# Patient Record
Sex: Female | Born: 1985 | Race: Black or African American | Hispanic: No | Marital: Single | Smoking: Never smoker
Health system: Southern US, Community
[De-identification: ages and names within clinical notes are randomized; demographics above are authoritative.]

---

## 2007-02-16 ENCOUNTER — Emergency Department (HOSPITAL_COMMUNITY): Admission: EM | Admit: 2007-02-16 | Discharge: 2007-02-16 | Payer: Self-pay | Admitting: Emergency Medicine

## 2007-06-07 ENCOUNTER — Inpatient Hospital Stay (HOSPITAL_COMMUNITY): Admission: AC | Admit: 2007-06-07 | Discharge: 2007-06-11 | Payer: Self-pay
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
## 2009-01-21 ENCOUNTER — Inpatient Hospital Stay (HOSPITAL_COMMUNITY): Admission: AD | Admit: 2009-01-21 | Discharge: 2009-01-21 | Payer: Self-pay | Admitting: Obstetrics and Gynecology

## 2009-03-16 ENCOUNTER — Inpatient Hospital Stay (HOSPITAL_COMMUNITY): Admission: AD | Admit: 2009-03-16 | Discharge: 2009-03-19 | Payer: Self-pay | Admitting: Obstetrics and Gynecology

## 2010-03-24 ENCOUNTER — Emergency Department (HOSPITAL_COMMUNITY): Admission: EM | Admit: 2010-03-24 | Discharge: 2010-03-24 | Payer: Self-pay | Admitting: Family Medicine

## 2010-08-16 LAB — POCT RAPID STREP A (OFFICE): Streptococcus, Group A Screen (Direct): NEGATIVE

## 2010-09-07 LAB — CBC
Hemoglobin: 11.3 g/dL — ABNORMAL LOW (ref 12.0–15.0)
MCV: 79.1 fL (ref 78.0–100.0)
Platelets: 214 10*3/uL (ref 150–400)
RBC: 3.95 MIL/uL (ref 3.87–5.11)
RDW: 15.3 % (ref 11.5–15.5)
WBC: 10.3 10*3/uL (ref 4.0–10.5)

## 2010-09-09 LAB — WET PREP, GENITAL: Trich, Wet Prep: NONE SEEN
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
## 2011-02-22 LAB — CBC
HCT: 33.3 — ABNORMAL LOW
HCT: 33.8 — ABNORMAL LOW
HCT: 34.7 — ABNORMAL LOW
HCT: 36.1
HCT: 37
HCT: 37.3
HCT: 38.8
Hemoglobin: 10.8 — ABNORMAL LOW
Hemoglobin: 11.2 — ABNORMAL LOW
Hemoglobin: 11.5 — ABNORMAL LOW
Hemoglobin: 11.6 — ABNORMAL LOW
Hemoglobin: 12.4
MCHC: 32
MCHC: 32.2
MCV: 72.7 — ABNORMAL LOW
MCV: 74 — ABNORMAL LOW
MCV: 74.4 — ABNORMAL LOW
Platelets: 176
Platelets: 188
Platelets: 193
Platelets: 207
Platelets: 222
Platelets: 243
RBC: 4.51
RBC: 4.86
RBC: 5.23 — ABNORMAL HIGH
RDW: 13.5
RDW: 13.7
RDW: 13.8
RDW: 13.8
WBC: 5.9
WBC: 6.7
WBC: 6.7
WBC: 7.7
WBC: 8.2
WBC: 8.4

## 2011-02-22 LAB — BASIC METABOLIC PANEL
BUN: 3 — ABNORMAL LOW
Calcium: 8.3 — ABNORMAL LOW
Calcium: 8.5
Creatinine, Ser: 0.87
Creatinine, Ser: 0.87
GFR calc non Af Amer: >60
Glucose, Bld: 100 — ABNORMAL HIGH
Potassium: 4.2

## 2011-02-22 LAB — SAMPLE TO BLOOD BANK

## 2011-02-22 LAB — PROTIME-INR: Prothrombin Time: 13.8

## 2011-02-22 LAB — DIFFERENTIAL
Basophils Absolute: 0
Basophils Relative: 0
Eosinophils Relative: 2
Monocytes Absolute: 0.4
Monocytes Relative: 7
Neutro Abs: 4.4

## 2018-12-22 ENCOUNTER — Other Ambulatory Visit: Payer: Self-pay

## 2018-12-22 ENCOUNTER — Emergency Department
Admission: EM | Admit: 2018-12-22 | Discharge: 2018-12-22 | Disposition: A | Discharge status: Home / Self Care | Payer: BC Managed Care – PPO | Source: Home / Self Care | Attending: Family Medicine | Admitting: Family Medicine

## 2018-12-22 ENCOUNTER — Encounter: Payer: Self-pay | Admitting: Emergency Medicine

## 2018-12-22 DIAGNOSIS — J020 Streptococcal pharyngitis: Secondary | ICD-10-CM | POA: Diagnosis not present

## 2018-12-22 DIAGNOSIS — J039 Acute tonsillitis, unspecified: Secondary | ICD-10-CM | POA: Diagnosis not present

## 2018-12-22 LAB — POCT RAPID STREP A (OFFICE): Rapid Strep A Screen: POSITIVE — AB

## 2018-12-22 MED ORDER — IBUPROFEN 100 MG/5ML PO SUSP
600.0000 mg | Freq: Once | ORAL | Status: AC
  Administered 2018-12-22: 600 mg via ORAL

## 2018-12-22 MED ORDER — IBUPROFEN 100 MG/5ML PO SUSP: 400.0000 mg | Freq: Four times a day (QID) | ORAL | 0 refills | Status: DC

## 2018-12-22 MED ORDER — PENICILLIN G BENZATHINE & PROC 1200000 UNIT/2ML IM SUSP
1.2000 10*6.[IU] | Freq: Once | INTRAMUSCULAR | Status: AC
  Administered 2018-12-22: 1.2 10*6.[IU] via INTRAMUSCULAR

## 2018-12-22 MED ORDER — ACETAMINOPHEN 160 MG/5ML PO ELIX: 500.0000 mg | ORAL_SOLUTION | ORAL | 0 refills | Status: DC | PRN

## 2018-12-22 MED ORDER — DEXAMETHASONE SODIUM PHOSPHATE 10 MG/ML IJ SOLN
10.0000 mg | Freq: Once | INTRAMUSCULAR | Status: AC
  Administered 2018-12-22: 10 mg via INTRAMUSCULAR

## 2018-12-22 NOTE — ED Provider Notes (Signed)
[facility]    CSN: [id number] Arrival date & time: 12/22/18  [id number]     History   Chief Complaint Chief Complaint  Patient presents with  . Sore Throat  . Chills    HPI Megan Campos is a 33 y.o. female.   HPI Megan Campos is a 33 y.o. female presenting to UC with c/o sore throat, subjective fever and chills that started 2 days ago. Mild headache and nausea w/o vomiting. She has taken OTC Tylenol with mild relief. Last dose of tylenol earlier this morning. She is able to keep down fluids but loss of appetite due to severe sore throat. Denies known sick contacts. No recent travel. Denies cough, congestion, chest tightness or SOB.   History reviewed. No pertinent past medical history.  There are no active problems to display for this patient.   History reviewed. No pertinent surgical history.  OB History   No obstetric history on file.      Home Medications    Prior to Admission medications   Medication Sig Start Date End Date Taking? Authorizing Provider  acetaminophen (TYLENOL) 325 MG tablet Take 650 mg by mouth every 6 (six) hours as needed.   Yes [provider]  acetaminophen (TYLENOL) 160 MG/5ML elixir Take 15.6 mLs (500 mg total) by mouth every 4 (four) hours as needed for fever or pain. 12/22/18   Lurene ShadowPhelps, Cathi Hazan O, PA-C  ibuprofen (ADVIL) 100 MG/5ML suspension Take 20-30 mLs (400-600 mg total) by mouth every 6 (six) hours. As need for pain 12/22/18   Rolla PlatePhelps, Ghali Morissette O, PA-C    Family History History reviewed. No pertinent family history.  Social History Social History   Tobacco Use  . Smoking status: Never Smoker  . Smokeless tobacco: Never Used  Substance Use Topics  . Alcohol use: Not Currently  . Drug use: Not Currently     Allergies   Patient has no known allergies.   Review of Systems Review of Systems  Constitutional: Positive for chills, fatigue and fever.  HENT: Positive for sore throat. Negative for congestion, ear pain,  trouble swallowing and voice change.   Respiratory: Negative for cough and shortness of breath.   Cardiovascular: Negative for chest pain and palpitations.  Gastrointestinal: Positive for nausea. Negative for abdominal pain, diarrhea and vomiting.  Musculoskeletal: Negative for arthralgias, back pain and myalgias.  Skin: Negative for rash.  Neurological: Positive for headaches. Negative for dizziness and light-headedness.     Physical Exam Triage Vital Signs ED Triage Vitals  Enc Vitals Group     BP      Pulse      Resp      Temp      Temp src      SpO2      Weight      Height      Head Circumference      Peak Flow      Pain Score      Pain Loc      Pain Edu?      Excl. in GC?    No data found.  Updated Vital Signs BP 99/64 (BP Location: Right Arm)   Pulse (!) 104   Temp 99 F (37.2 C) (Oral)   LMP 12/10/2018 (Exact Date)   SpO2 100%   Visual Acuity Right Eye Distance:   Left Eye Distance:   Bilateral Distance:    Right Eye Near:   Left Eye Near:    Bilateral Near:  Physical Exam Vitals signs and nursing note reviewed.  Constitutional:      Appearance: She is well-developed.  HENT:     Head: Normocephalic and atraumatic.     Right Ear: Tympanic membrane normal.     Left Ear: Tympanic membrane normal.     Nose: Nose normal.     Right Sinus: No maxillary sinus tenderness or frontal sinus tenderness.     Left Sinus: No maxillary sinus tenderness or frontal sinus tenderness.     Mouth/Throat:     Lips: Pink.     Mouth: Mucous membranes are moist.     Pharynx: Uvula midline. Pharyngeal swelling and posterior oropharyngeal erythema present. No oropharyngeal exudate.     Tonsils: Tonsillar exudate present. No tonsillar abscesses. 3+ on the right. 3+ on the left.  Neck:     Musculoskeletal: Normal range of motion and neck supple.  Cardiovascular:     Rate and Rhythm: Normal rate and regular rhythm.  Pulmonary:     Effort: Pulmonary effort is normal. No  respiratory distress.     Breath sounds: Normal breath sounds. No stridor. No wheezing, rhonchi or rales.  Musculoskeletal: Normal range of motion.  Lymphadenopathy:     Cervical: Cervical adenopathy present.  Skin:    General: Skin is warm and dry.  Neurological:     Mental Status: She is alert and oriented to person, place, and time.  Psychiatric:        Behavior: Behavior normal.      UC Treatments / Results  Labs (all labs ordered are listed, but only abnormal results are displayed) Labs Reviewed  POCT RAPID STREP A (OFFICE) - Abnormal; Notable for the following components:      Result Value   Rapid Strep A Screen Positive (*)    All other components within normal limits    EKG   Radiology No results found.  Procedures Procedures (including critical care time)  Medications Ordered in UC Medications  penicillin g procaine-penicillin g benzathine (BICILLIN-CR) injection 600000-600000 units (1.2 Million Units Intramuscular Given 12/22/18 1105)  dexamethasone (DECADRON) injection 10 mg (10 mg Intramuscular Given 12/22/18 1106)  ibuprofen (ADVIL) 100 MG/5ML suspension 600 mg (600 mg Oral Given 12/22/18 1106)    Initial Impression / Assessment and Plan / UC Course  I have reviewed the triage vital signs and the nursing notes.  Pertinent labs & imaging results that were available during my care of the patient were reviewed by me and considered in my medical decision making (see chart for details).     Rapid strep: POSITIVE Discussed treatment options. Pt agreeable to have PCN G given today.   Home care info provided.  Final Clinical Impressions(s) / UC Diagnoses   Final diagnoses:  Exudative tonsillitis  Strep pharyngitis     Discharge Instructions      You have been given a shot of a strong antibiotic, which should stay in your system treating your symptoms.  You should feel better over the next few days and definitely by the end of the week. If you are not  feeling improvement, please be reevaluated by a medical provider.   You may take 500mg  acetaminophen every 4-6 hours or in combination with ibuprofen 400-600mg  every 6-8 hours as needed for pain, inflammation, and fever. (a liquid version has been prescribed and sent to your pharmacy to make it easier to swallow these medications)  Be sure to drink at least eight 8oz glasses of water to stay well hydrated (you  may also suck on ice chips and popsicles, sip on warm broth, and eat soft foods such as Jello and apple sauce to help you stay hydrated) and get at least 8 hours of sleep at night, preferably more while sick.   Call 911 or have someone drive you to the hospital if you develop difficulty breathing, unable to swallow liquids, or other new concerning symptoms develop.    ED Prescriptions    Medication Sig Dispense Auth. Provider   ibuprofen (ADVIL) 100 MG/5ML suspension Take 20-30 mLs (400-600 mg total) by mouth every 6 (six) hours. As need for pain 272 mL Gerarda Fraction, Breyden Jeudy O, PA-C   acetaminophen (TYLENOL) 160 MG/5ML elixir Take 15.6 mLs (500 mg total) by mouth every 4 (four) hours as needed for fever or pain. 237 mL Noe Gens, PA-C     Controlled Substance Prescriptions [place] Controlled Substance Registry consulted? Not Applicable   Tyrell Antonio 12/23/18 1583

## 2018-12-22 NOTE — ED Notes (Signed)
Sore throat, swollen tonsils since yesterday

## 2018-12-22 NOTE — Discharge Instructions (Signed)
°  You have been given a shot of a strong antibiotic, which should stay in your system treating your symptoms.  You should feel better over the next few days and definitely by the end of the week. If you are not feeling improvement, please be reevaluated by a medical provider.   You may take 500mg  acetaminophen every 4-6 hours or in combination with ibuprofen 400-600mg  every 6-8 hours as needed for pain, inflammation, and fever. (a liquid version has been prescribed and sent to your pharmacy to make it easier to swallow these medications)  Be sure to drink at least eight 8oz glasses of water to stay well hydrated (you may also suck on ice chips and popsicles, sip on warm broth, and eat soft foods such as Jello and apple sauce to help you stay hydrated) and get at least 8 hours of sleep at night, preferably more while sick.   Call 911 or have someone drive you to the hospital if you develop difficulty breathing, unable to swallow liquids, or other new concerning symptoms develop.

## 2018-12-24 ENCOUNTER — Telehealth: Payer: Self-pay

## 2018-12-24 ENCOUNTER — Other Ambulatory Visit: Payer: Self-pay

## 2018-12-24 ENCOUNTER — Emergency Department (INDEPENDENT_AMBULATORY_CARE_PROVIDER_SITE_OTHER)
Admission: EM | Admit: 2018-12-24 | Discharge: 2018-12-24 | Disposition: A | Discharge status: Home / Self Care | Payer: BC Managed Care – PPO | Source: Home / Self Care | Attending: Family Medicine | Admitting: Family Medicine

## 2018-12-24 DIAGNOSIS — J0300 Acute streptococcal tonsillitis, unspecified: Secondary | ICD-10-CM | POA: Diagnosis not present

## 2018-12-24 LAB — POCT MONO SCREEN (KUC): Mono, POC: NEGATIVE

## 2018-12-24 MED ORDER — LIDOCAINE VISCOUS HCL 2 % MT SOLN: 15.0000 mL | Freq: Three times a day (TID) | OROMUCOSAL | 0 refills | Status: DC

## 2018-12-24 MED ORDER — PREDNISONE 20 MG PO TABS: ORAL_TABLET | ORAL | 0 refills | Status: DC

## 2018-12-24 MED ORDER — CLINDAMYCIN HCL 300 MG PO CAPS: 300.0000 mg | ORAL_CAPSULE | Freq: Three times a day (TID) | ORAL | 0 refills | Status: AC

## 2018-12-24 NOTE — Discharge Instructions (Addendum)
Try warm salt water gargles for sore throat. May take Tylenol as needed for pain. Recommend taking a probiotic daily while taking antibiotic.  If symptoms become significantly worse during the night or over the weekend, proceed to the local emergency room.

## 2018-12-24 NOTE — ED Provider Notes (Signed)
[facility]    CSN: [id number] Arrival date & time: 12/24/18  1240     History   Chief Complaint Chief Complaint  Patient presents with  . Sore Throat    HPI Ashmi Blas is a 33 y.o. female.   Patient was treated here two days ago for strep tonsillitis with penicillin injection and decadron.  She reports that she has not improved, although she does not now have a fever.  She is able to swallow her saliva and liquids.  She notes that her left throat is more painful than the right.     History reviewed. No pertinent past medical history.  There are no active problems to display for this patient.   History reviewed. No pertinent surgical history.  OB History   No obstetric history on file.      Home Medications    Prior to Admission medications   Medication Sig Start Date End Date Taking? Authorizing Provider  acetaminophen (TYLENOL) 160 MG/5ML elixir Take 15.6 mLs (500 mg total) by mouth every 4 (four) hours as needed for fever or pain. 12/22/18   Noe Gens, PA-C  acetaminophen (TYLENOL) 325 MG tablet Take 650 mg by mouth every 6 (six) hours as needed.    [provider]  clindamycin (CLEOCIN) 300 MG capsule Take 1 capsule (300 mg total) by mouth 3 (three) times daily for 10 days. Take one cap PO Q8hr 12/24/18 01/03/19  Kandra Nicolas, MD  ibuprofen (ADVIL) 100 MG/5ML suspension Take 20-30 mLs (400-600 mg total) by mouth every 6 (six) hours. As need for pain 12/22/18   Leeroy Cha O, PA-C  lidocaine (XYLOCAINE) 2 % solution Use as directed 15 mLs in the mouth or throat 4 (four) times daily -  before meals and at bedtime. Gargle, then expectorate 12/24/18   Kandra Nicolas, MD  predniSONE (DELTASONE) 20 MG tablet Take one tab by mouth twice daily for 4 days, then one daily. Take with food. 12/24/18   Kandra Nicolas, MD    Family History History reviewed. No pertinent family history.  Social History Social History   Tobacco Use  . Smoking  status: Never Smoker  . Smokeless tobacco: Never Used  Substance Use Topics  . Alcohol use: Not Currently  . Drug use: Not Currently     Allergies   Patient has no known allergies.   Review of Systems Review of Systems + sore throat No cough No pleuritic pain No wheezing No nasal congestion ? post-nasal drainage No sinus pain/pressure No itchy/red eyes No earache No hemoptysis No SOB No fever/chills No nausea No vomiting No abdominal pain No diarrhea No urinary symptoms No skin rash + fatigue No myalgias No headache    Physical Exam Triage Vital Signs ED Triage Vitals  Enc Vitals Group     BP 12/24/18 1308 101/68     Pulse Rate 12/24/18 1308 87     Resp 12/24/18 1308 20     Temp 12/24/18 1308 98.6 F (37 C)     Temp Source 12/24/18 1308 Oral     SpO2 12/24/18 1308 100 %     Weight --      Height --      Head Circumference --      Peak Flow --      Pain Score 12/24/18 1309 8     Pain Loc --      Pain Edu? --      Excl. in [place]? --  No data found.  Updated Vital Signs BP 101/68 (BP Location: Right Arm)   Pulse 87   Temp 98.6 F (37 C) (Oral)   Resp 20   LMP 12/10/2018 (Exact Date)   SpO2 100%   Visual Acuity Right Eye Distance:   Left Eye Distance:   Bilateral Distance:    Right Eye Near:   Left Eye Near:    Bilateral Near:     Physical Exam Nursing notes and Vital Signs reviewed. Appearance:  Patient appears stated age, and in no acute distress Eyes:  Pupils are equal, round, and reactive to light and accomodation.  Extraocular movement is intact.  Conjunctivae are not inflamed  Ears:  Canals normal.  Tympanic membranes normal.  Nose:  Normal turbinates.  No sinus tenderness.   Pharynx:  Erythematous and swollen without obstruction.  Left tonsil slightly larger than right. Exudate is present.  No trismus. Neck:  Supple.  Enlarged tender bilateral nodes.  Lungs:  Clear to auscultation.  Breath sounds are equal.  Moving air well.  Heart:  Regular rate and rhythm without murmurs, rubs, or gallops.  Abdomen:  Nontender without masses or hepatosplenomegaly. There is mild tenderness over the spleen.  Bowel sounds are present.  No CVA or flank tenderness.  Extremities:  No edema.  Skin:  No rash present.    UC Treatments / Results  Labs (all labs ordered are listed, but only abnormal results are displayed) Labs Reviewed   POCT mono screen negative    EKG   Radiology No results found.  Procedures Procedures (including critical care time)  Medications Ordered in UC Medications - No data to display  Initial Impression / Assessment and Plan / UC Course  I have reviewed the triage vital signs and the nursing notes.  Pertinent labs & imaging results that were available during my care of the patient were reviewed by me and considered in my medical decision making (see chart for details).    Patient not improved after initial treatment.  She does not appear to have tonsillar abscess at this time. Begin clindamycin and prednisone burst/taper.  Rx for lidocaine viscous. Followup with ENT if not improved one week.   Final Clinical Impressions(s) / UC Diagnoses   Final diagnoses:  Streptococcal tonsillitis     Discharge Instructions     Try warm salt water gargles for sore throat. May take Tylenol as needed for pain. Recommend taking a probiotic daily while taking antibiotic.  If symptoms become significantly worse during the night or over the weekend, proceed to the local emergency room.     ED Prescriptions    Medication Sig Dispense Auth. Provider   clindamycin (CLEOCIN) 300 MG capsule Take 1 capsule (300 mg total) by mouth 3 (three) times daily for 10 days. Take one cap PO Q8hr 30 capsule Lattie HawBeese, Koree Schopf A, MD   predniSONE (DELTASONE) 20 MG tablet Take one tab by mouth twice daily for 4 days, then one daily. Take with food. 12 tablet Lattie HawBeese, Delainy Mcelhiney A, MD   lidocaine (XYLOCAINE) 2 % solution Use as  directed 15 mLs in the mouth or throat 4 (four) times daily -  before meals and at bedtime. Gargle, then expectorate 200 mL Lattie HawBeese, Canda Podgorski A, MD         Lattie HawBeese, Rozlynn Lippold A, MD 12/25/18 [phone]

## 2018-12-24 NOTE — ED Triage Notes (Signed)
Pt was seen Monday and was positive for strep.  She is not feeling any better, actually feeling worse.

## 2018-12-24 NOTE — Telephone Encounter (Signed)
Pt not feeling any better.  Will follow up here or with PCP.

## 2018-12-25 ENCOUNTER — Telehealth: Payer: Self-pay | Admitting: Emergency Medicine

## 2019-01-16 ENCOUNTER — Other Ambulatory Visit: Payer: Self-pay

## 2019-01-16 ENCOUNTER — Other Ambulatory Visit (HOSPITAL_COMMUNITY)
Admission: RE | Admit: 2019-01-16 | Discharge: 2019-01-16 | Disposition: A | Discharge status: Home / Self Care | Payer: BC Managed Care – PPO | Source: Ambulatory Visit | Attending: Family Medicine | Admitting: Family Medicine

## 2019-01-16 ENCOUNTER — Encounter: Payer: Self-pay | Admitting: *Deleted

## 2019-01-16 ENCOUNTER — Emergency Department
Admission: EM | Admit: 2019-01-16 | Discharge: 2019-01-16 | Disposition: A | Discharge status: Home / Self Care | Payer: BC Managed Care – PPO | Source: Home / Self Care

## 2019-01-16 DIAGNOSIS — Z202 Contact with and (suspected) exposure to infections with a predominantly sexual mode of transmission: Secondary | ICD-10-CM | POA: Insufficient documentation

## 2019-01-16 DIAGNOSIS — R221 Localized swelling, mass and lump, neck: Secondary | ICD-10-CM

## 2019-01-16 LAB — T4, FREE: Free T4: 1.1 ng/dL (ref 0.8–1.8)

## 2019-01-16 LAB — TSH: TSH: 2.48 mIU/L

## 2019-01-16 NOTE — ED Provider Notes (Signed)
[facility]    CSN: [id number] Arrival date & time: 01/16/19  1509     History   Chief Complaint Chief Complaint  Patient presents with  . Rash    HPI Teresita Maduraaige Simeone is a 33 y.o. female.   Patient was recently retreated for strep pharyngitis with clindamycin by her ENT.  She finished the medication about a week ago.  She reports that she had had some white patches on her tongue, now resolved.  She also had had a faint rash on her face, now resolved.  She admits that she had had unprotected sex about six weeks ago and requests STD and HIV testing.  She denies vaginal discharge or pelvic pain, and feels well otherwise.  The history is provided by the patient.    History reviewed. No pertinent past medical history.  There are no active problems to display for this patient.   History reviewed. No pertinent surgical history.  OB History   No obstetric history on file.      Home Medications    Prior to Admission medications   Not on File    Family History History reviewed. No pertinent family history.  Social History Social History   Tobacco Use  . Smoking status: Never Smoker  . Smokeless tobacco: Never Used  Substance Use Topics  . Alcohol use: Not Currently  . Drug use: Not Currently     Allergies   Patient has no known allergies.   Review of Systems Review of Systems + sore throat, resolved No cough No pleuritic pain No wheezing No nasal congestion No post-nasal drainage No sinus pain/pressure No itchy/red eyes No earache No hemoptysis No SOB No fever/chills No nausea No vomiting No pelvic or abdominal pain No vaginal discharge No diarrhea No urinary symptoms + skin rash, resolved No fatigue No myalgias No headache    Physical Exam Triage Vital Signs ED Triage Vitals  Enc Vitals Group     BP      Pulse      Resp      Temp      Temp src      SpO2      Weight      Height      Head Circumference      Peak Flow       Pain Score      Pain Loc      Pain Edu?      Excl. in [place]?    No data found.  Updated Vital Signs BP 130/77 (BP Location: Right Arm)   Pulse 91   Temp 98.2 F (36.8 C) (Oral)   Resp 16   Ht 5\' 4"  (1.626 m)   Wt 56.2 kg   LMP 01/12/2019   SpO2 98%   BMI 21.28 kg/m   Visual Acuity Right Eye Distance:   Left Eye Distance:   Bilateral Distance:    Right Eye Near:   Left Eye Near:    Bilateral Near:     Physical Exam Nursing notes and Vital Signs reviewed. Appearance:  Patient appears stated age, and in no acute distress Eyes:  Pupils are equal, round, and reactive to light and accomodation.  Extraocular movement is intact.  Conjunctivae are not inflamed  Ears:  Canals normal.  Tympanic membranes normal.  Nose:  Normal turbinates.  No sinus tenderness.   Mouth:  Normal without lesions Pharynx:  Normal Neck:  Supple. No adenopathy.  Thyroid is mildly tender to palpation but  symmetric without nodules. Lungs:  Clear to auscultation.  Breath sounds are equal.  Moving air well. Heart:  Regular rate and rhythm without murmurs, rubs, or gallops.  Abdomen:  Nontender without masses or hepatosplenomegaly.  Bowel sounds are present.  No CVA or flank tenderness.  Extremities:  No edema.  Skin:  No rash present.    UC Treatments / Results  Labs (all labs ordered are listed, but only abnormal results are displayed) Labs Reviewed  HIV ANTIBODY (ROUTINE TESTING W REFLEX)  RPR  TSH  T4, FREE  CERVICOVAGINAL ANCILLARY ONLY    EKG   Radiology No results found.  Procedures Procedures      Medications Ordered in UC Medications - No data to display  Initial Impression / Assessment and Plan / UC Course  I have reviewed the triage vital signs and the nursing notes.  Pertinent labs & imaging results that were available during my care of the patient were reviewed by me and considered in my medical decision making (see chart for details).      Patient's mouth and  pharynx appear normal and there is no cervical adenopathy. Because her thyroid is mildly tender to palpation, will check TSH and free T4.  Recommend follow-up with PCP for annual exam HIV, RPR, and GC/chlamydia pending. Final Clinical Impressions(s) / UC Diagnoses   Final diagnoses:  Possible exposure to STD  Neck fullness     Discharge Instructions           ED Prescriptions    None         Kandra Nicolas, MD 01/17/19 [phone]

## 2019-01-16 NOTE — ED Triage Notes (Signed)
Pt reports that she had strep throat for 3 wks, a rash on her face and white spots on her tongue recently. She is concerned about HIV and request testing.

## 2019-01-19 LAB — HIV ANTIBODY (ROUTINE TESTING W REFLEX): HIV 1&2 Ab, 4th Generation: NONREACTIVE

## 2019-01-19 LAB — RPR: RPR Ser Ql: NONREACTIVE

## 2019-01-20 LAB — CERVICOVAGINAL ANCILLARY ONLY
Bacterial vaginitis: NEGATIVE
Candida vaginitis: NEGATIVE
Chlamydia: NEGATIVE
Neisseria Gonorrhea: NEGATIVE
Trichomonas: NEGATIVE

## 2019-01-22 ENCOUNTER — Telehealth: Payer: Self-pay

## 2019-01-22 NOTE — Telephone Encounter (Signed)
Notified patient of neg lab results.  Will follow up as needed.
